# Patient Record
Sex: Male | Born: 1980 | Hispanic: No | Marital: Single | Smoking: Never smoker
Health system: Southern US, Community
[De-identification: ages and names within clinical notes are randomized; demographics above are authoritative.]

## PROBLEM LIST (undated history)

## (undated) DIAGNOSIS — F909 Attention-deficit hyperactivity disorder, unspecified type: Secondary | ICD-10-CM

## (undated) HISTORY — DX: Attention-deficit hyperactivity disorder, unspecified type: F90.9

---

## 2002-03-14 ENCOUNTER — Encounter: Payer: Self-pay | Admitting: Emergency Medicine

## 2002-03-14 ENCOUNTER — Emergency Department (HOSPITAL_COMMUNITY): Admission: EM | Admit: 2002-03-14 | Discharge: 2002-03-14 | Payer: Self-pay | Admitting: Emergency Medicine

## 2002-09-28 ENCOUNTER — Emergency Department (HOSPITAL_COMMUNITY): Admission: EM | Admit: 2002-09-28 | Discharge: 2002-09-28 | Payer: Self-pay | Admitting: Emergency Medicine

## 2002-09-28 ENCOUNTER — Encounter: Payer: Self-pay | Admitting: Emergency Medicine

## 2011-10-07 ENCOUNTER — Ambulatory Visit
Admission: RE | Admit: 2011-10-07 | Discharge: 2011-10-07 | Disposition: A | Discharge status: Home / Self Care | Payer: Self-pay | Source: Ambulatory Visit | Attending: Family Medicine | Admitting: Family Medicine

## 2011-10-07 ENCOUNTER — Other Ambulatory Visit: Payer: Self-pay | Admitting: Family Medicine

## 2011-10-07 DIAGNOSIS — M25561 Pain in right knee: Secondary | ICD-10-CM

## 2011-10-19 ENCOUNTER — Ambulatory Visit (INDEPENDENT_AMBULATORY_CARE_PROVIDER_SITE_OTHER): Payer: BC Managed Care – PPO | Admitting: Family Medicine

## 2011-10-19 ENCOUNTER — Ambulatory Visit: Payer: BC Managed Care – PPO

## 2011-10-19 DIAGNOSIS — R0789 Other chest pain: Secondary | ICD-10-CM | POA: Insufficient documentation

## 2011-10-19 DIAGNOSIS — Z87898 Personal history of other specified conditions: Secondary | ICD-10-CM

## 2011-10-19 DIAGNOSIS — B9789 Other viral agents as the cause of diseases classified elsewhere: Secondary | ICD-10-CM

## 2011-10-19 DIAGNOSIS — B349 Viral infection, unspecified: Secondary | ICD-10-CM | POA: Insufficient documentation

## 2011-10-19 DIAGNOSIS — T887XXA Unspecified adverse effect of drug or medicament, initial encounter: Secondary | ICD-10-CM

## 2011-10-19 DIAGNOSIS — T50905A Adverse effect of unspecified drugs, medicaments and biological substances, initial encounter: Secondary | ICD-10-CM

## 2011-10-19 LAB — POCT CBC
Granulocyte percent: 50.3 %G (ref 37–80)
HCT, POC: 43.9 % (ref 43.5–53.7)
Hemoglobin: 14.2 g/dL (ref 14.1–18.1)
Lymph, poc: 2.7 (ref 0.6–3.4)
MCH, POC: 30.1 pg (ref 27–31.2)
MCHC: 32.3 g/dL (ref 31.8–35.4)
MCV: 93.1 fL (ref 80–97)
MID (cbc): 0.6 (ref 0–0.9)
MPV: 8.6 fL (ref 0–99.8)
POC Granulocyte: 3.3 (ref 2–6.9)
POC LYMPH PERCENT: 40.2 %L (ref 10–50)
POC MID %: 9.5 %M (ref 0–12)
Platelet Count, POC: 189 10*3/uL (ref 142–424)
RBC: 4.72 M/uL (ref 4.69–6.13)
RDW, POC: 12.4 %
WBC: 6.6 10*3/uL (ref 4.6–10.2)

## 2011-10-19 MED ORDER — BENZONATATE 100 MG PO CAPS: 100.0000 mg | ORAL_CAPSULE | Freq: Two times a day (BID) | ORAL | Status: AC | PRN

## 2011-10-19 MED ORDER — SUCRALFATE 1 GM/10ML PO SUSP: 1.0000 g | Freq: Four times a day (QID) | ORAL | Status: AC

## 2011-10-19 MED ORDER — GI COCKTAIL ~~LOC~~
30.0000 mL | Freq: Once | ORAL | Status: AC
  Administered 2011-10-19: 30 mL via ORAL

## 2011-10-19 NOTE — Progress Notes (Signed)
  Subjective:    Patient ID: Jesse Rodriguez, male    DOB: 14-Mar-1981, 31 y.o.   MRN: [id number]  HPI Patient is here with a couple week history of right-sided chest pain. He has a long history of GERD. In the past he's been able to take acid blocking medications and get relief of his heartburn sensation. This has been different. Medicines did not help it and he keeps on having this burning sensation in the primarily right side of his chest. 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 He is concerned about possibility of any toxic exposure. He saw his dermatologist a couple of weeks ago was placed on a medication for his scalp which she's been taking a couple of times a day. This may be in the doxycycline family of medications but he is not sure the name of it. He has had any upper chart record infection since yesterday, a flulike illness. With that he is not febrile, taken ibuprofen, and being generally achy and ill feeling. He does not relate that to the other problem with chest pain.   Review of Systems  HENT: Positive for rhinorrhea.   Respiratory: Positive for cough.   Cardiovascular: Positive for chest pain.  Gastrointestinal: Negative.        Objective:   Physical Exam  Constitutional: He appears well-developed and well-nourished.  Eyes: Conjunctivae are normal.  Neck: Normal range of motion. No tracheal deviation present. No thyromegaly present.  Cardiovascular: Normal rate, regular rhythm and normal heart sounds.   Pulmonary/Chest: Effort normal and breath sounds normal. No respiratory distress. He has no wheezes. He has no rales. He exhibits no tenderness.  Abdominal: Bowel sounds are normal. He exhibits no distension and no mass. There is no tenderness. There is no rebound and no guarding.  Lymphadenopathy:    He has no cervical adenopathy.          Assessment & Plan:  X-ray was done of patient's chest lungs were clear without any  infiltrate normal cardiac silhouette normal chest x-ray  Patient had a right right sided substernal pains and got some relief with a GI cocktail. I believe he probably has esophagitis from the doxycycline and ibuprofen combined. Will treat with Carafate to see if that gives relief.  Respiratory tract infection is a nonspecific bowel infection and follow should give relief. Cough could irritate the reflux.

## 2011-10-19 NOTE — Patient Instructions (Signed)
Take the medication before meals and at bedtime

## 2011-10-20 LAB — COMPREHENSIVE METABOLIC PANEL
ALT: 27 U/L (ref 0–53)
AST: 33 U/L (ref 0–37)
Albumin: 4.6 g/dL (ref 3.5–5.2)
Alkaline Phosphatase: 45 U/L (ref 39–117)
BUN: 19 mg/dL (ref 6–23)
CO2: 28 mEq/L (ref 19–32)
Calcium: 9.5 mg/dL (ref 8.4–10.5)
Chloride: 102 mEq/L (ref 96–112)
Creat: 1.06 mg/dL (ref 0.50–1.35)
Glucose, Bld: 81 mg/dL (ref 70–99)
Potassium: 4.4 mEq/L (ref 3.5–5.3)
Sodium: 138 mEq/L (ref 135–145)
Total Bilirubin: 0.7 mg/dL (ref 0.3–1.2)
Total Protein: 7.1 g/dL (ref 6.0–8.3)

## 2011-10-22 ENCOUNTER — Encounter: Payer: Self-pay | Admitting: Family Medicine

## 2013-02-14 ENCOUNTER — Ambulatory Visit: Payer: BC Managed Care – PPO

## 2013-02-14 ENCOUNTER — Ambulatory Visit (INDEPENDENT_AMBULATORY_CARE_PROVIDER_SITE_OTHER): Payer: BC Managed Care – PPO | Admitting: Family Medicine

## 2013-02-14 ENCOUNTER — Other Ambulatory Visit: Payer: Self-pay | Admitting: *Deleted

## 2013-02-14 VITALS — BP 120/80 | HR 71 | Temp 97.7°F | Resp 18 | Wt 181.0 lb

## 2013-02-14 DIAGNOSIS — M25511 Pain in right shoulder: Secondary | ICD-10-CM

## 2013-02-14 DIAGNOSIS — M25519 Pain in unspecified shoulder: Secondary | ICD-10-CM

## 2013-02-14 MED ORDER — CYCLOBENZAPRINE HCL 10 MG PO TABS: 10.0000 mg | ORAL_TABLET | Freq: Three times a day (TID) | ORAL | Status: AC | PRN

## 2013-02-14 MED ORDER — IBUPROFEN 800 MG PO TABS: 800.0000 mg | ORAL_TABLET | Freq: Three times a day (TID) | ORAL | Status: DC | PRN

## 2013-02-14 NOTE — Progress Notes (Signed)
[facility]:  Office Visit  Chief Complaint:  Chief Complaint  Patient presents with  . Shoulder Pain  . Arm Pain    HPI: Jesse Rodriguez is a right handed  32 y.o. male who complains of  Acute right shoulder pain , posteriorly located radiating down to his arm and hands. About 2 weeks ago he was lifting weight about 315 lbs and felt he pulled something. He went to his PCP and xrays were done and he did not have any fractures or dislocation. He was told to go see ortho but did not since he eventually got better with rest. He was prescribed a  msk relaxer, and also ibuprofen but never filled the prescription since had left over ibuprofen and felt better after taking them. Today he was carrying groceries and his shoulder starting hurting again.Marland Kitchen  He has 8-10/10 sharp pain, constant, worse with any movement. He can't lift his arm due to pain, the pain is in the back of his shoulder  and goes posteriorly down his right arm to his fingers. He has had prior injuries to the right shoulder.  No prior torn ligaments/dislocation in this shoulder that he knows of. He has a history of left shoulder dislocation but this feels different.  He is active, Played football in HS, Currently does kickboxing and works in [employer]. He denies any recent injuries in kickboxing/at work.  Denies swelling, warmth, fevers. Denies taking any steroids/androgen supplements Was taking Doxycycline 100 mg daily for acne x 1 week but stopped several weeks ago.    Past Medical History  Diagnosis Date  . ADHD (attention deficit hyperactivity disorder)    History reviewed. No pertinent past surgical history. History   Social History  . Marital Status: Single    Spouse Name: N/A    Number of Children: N/A  . Years of Education: N/A   Social History Main Topics  . Smoking status: Never Smoker   . Smokeless tobacco: None  . Alcohol Use: Yes  . Drug Use: No  . Sexually Active: Yes   Other Topics  Concern  . None   Social History Narrative  . None   Family History  Problem Relation Age of Onset  . Hyperlipidemia Father   . Stroke Father    No Known Allergies Prior to Admission medications   Medication Sig Start Date End Date Taking? Authorizing Provider  ibuprofen (ADVIL,MOTRIN) 800 MG tablet Take 800 mg by mouth every 8 (eight) hours as needed for pain.   Yes Historical Provider, MD  cyclobenzaprine (FLEXERIL) 10 MG tablet Take 10 mg by mouth 3 (three) times daily as needed for muscle spasms.    Historical Provider, MD     ROS: The patient denies fevers, chills, night sweats, unintentional weight loss, chest pain, palpitations, wheezing, dyspnea on exertion, nausea, vomiting, abdominal pain, dysuria, hematuria, melena, numbness, weakness, or tingling.   All other systems have been reviewed and were otherwise negative with the exception of those mentioned in the HPI and as above.    PHYSICAL EXAM: Filed Vitals:   02/14/13 1940  BP: 120/80  Pulse: 71  Temp: 97.7 F (36.5 C)  Resp: 18   Filed Vitals:   02/14/13 1940  Weight: 181 lb (82.101 kg)   Body mass index is 26.72 kg/(m^2).  General: Alert, moderate distress HEENT:  Normocephalic, atraumatic, oropharynx patent.  Cardiovascular:  Regular rate and rhythm, no rubs murmurs or gallops.   Respiratory: Clear to auscultation bilaterally.  No  wheezes, rales, or rhonchi.  No cyanosis, no use of accessory musculature GI: No organomegaly, abdomen is soft and non-tender, positive bowel sounds.  No masses. Skin: No rashes. Neurologic: Facial musculature symmetric. Psychiatric: Patient is appropriate throughout our interaction. Lymphatic: No cervical lymphadenopathy Musculoskeletal: Gait intact. Neck-normal Left shoulder normal Right shoulder-patient is guarding his shoulder. He is in significant pain with AROM/PROM. I am unable to perform an adequate physical exam + radial pulse, sensation intact in fingers.      LABS: Results for orders placed in visit on 10/19/11  COMPREHENSIVE METABOLIC PANEL      Result Value Range   Sodium 138  135 - 145 mEq/L   Potassium 4.4  3.5 - 5.3 mEq/L   Chloride 102  96 - 112 mEq/L   CO2 28  19 - 32 mEq/L   Glucose, Bld 81  70 - 99 mg/dL   BUN 19  6 - 23 mg/dL   Creat 6.60  6.30 - 1.60 mg/dL   Total Bilirubin 0.7  0.3 - 1.2 mg/dL   Alkaline Phosphatase 45  39 - 117 U/L   AST 33  0 - 37 U/L   ALT 27  0 - 53 U/L   Total Protein 7.1  6.0 - 8.3 g/dL   Albumin 4.6  3.5 - 5.2 g/dL   Calcium 9.5  8.4 - 10.9 mg/dL  POCT CBC      Result Value Range   WBC 6.6  4.6 - 10.2 K/uL   Lymph, poc 2.7  0.6 - 3.4   POC LYMPH PERCENT 40.2  10 - 50 %L   MID (cbc) 0.6  0 - 0.9   POC MID % 9.5  0 - 12 %M   POC Granulocyte 3.3  2 - 6.9   Granulocyte percent 50.3  37 - 80 %G   RBC 4.72  4.69 - 6.13 M/uL   Hemoglobin 14.2  14.1 - 18.1 g/dL   HCT, POC 32.3  55.7 - 53.7 %   MCV 93.1  80 - 97 fL   MCH, POC 30.1  27 - 31.2 pg   MCHC 32.3  31.8 - 35.4 g/dL   RDW, POC 32.2     Platelet Count, POC 189  142 - 424 K/uL   MPV 8.6  0 - 99.8 fL     EKG/XRAY:   Primary read interpreted by Dr. Conley Rolls at [facility]. No fractures or dislocation   ASSESSMENT/PLAN: Encounter Diagnosis  Name Primary?  . Pain in joint, shoulder region, right Yes   Jesse Rodriguez is a 32 y/o male who is here with acute right shoulder pain of unknown etiology. I was unable to do a thorough shoulder exam on him due to guarding and  pain with even slight active ROM.  He was given lortab 7.5 mg elixir for pain prior to xrays upon request and was fine. He tolerated xrays ok but was in pain during the exam. His xrays are negative for any dislocation or fractures.  Upon examination of his shoulder he became diaphoretic and dizzy. This was 15-20 min after he had gotten the Lortab. I suspect this may be a pain response  and he was having a vasovagal reaction. However it should be noted that he is on Adderall which he forgot to  tell me  and also forgot to tell me that he had some reaction to Percocet in the past.   He was better after we laid him down, he had a cold compress on  his head and drank some water.  He was given a sling to take home to use. I rx him flexeril and ibuprofen. I would be interested to know if there is any organic pathology with his shoulder. We will refer him to [facility] ortho for further evaluation since he has been there before. I am hoping he can be seen tomorrow as a work-in patient.      Hamilton Capri PHUONG, DO 02/15/2013 6:49 AM

## 2013-02-27 ENCOUNTER — Telehealth: Payer: Self-pay | Admitting: Family Medicine

## 2013-02-27 NOTE — Telephone Encounter (Signed)
LM to see how he is doing and what ortho said.

## 2015-07-15 ENCOUNTER — Ambulatory Visit (INDEPENDENT_AMBULATORY_CARE_PROVIDER_SITE_OTHER): Payer: Self-pay | Admitting: Sports Medicine

## 2015-07-15 DIAGNOSIS — M25562 Pain in left knee: Secondary | ICD-10-CM

## 2015-07-15 DIAGNOSIS — G629 Polyneuropathy, unspecified: Secondary | ICD-10-CM | POA: Insufficient documentation

## 2015-07-15 DIAGNOSIS — G5603 Carpal tunnel syndrome, bilateral upper limbs: Secondary | ICD-10-CM

## 2015-07-15 DIAGNOSIS — G6289 Other specified polyneuropathies: Secondary | ICD-10-CM

## 2015-07-15 DIAGNOSIS — G5600 Carpal tunnel syndrome, unspecified upper limb: Secondary | ICD-10-CM | POA: Insufficient documentation

## 2015-07-15 LAB — CBC WITH DIFFERENTIAL/PLATELET
Basophils Absolute: 0 K/uL (ref 0.0–0.1)
Basophils Relative: 0 % (ref 0–1)
Eosinophils Absolute: 0 10*3/uL (ref 0.0–0.7)
Eosinophils Relative: 1 % (ref 0–5)
HCT: 45.1 % (ref 39.0–52.0)
Hemoglobin: 15.8 g/dL (ref 13.0–17.0)
Lymphocytes Relative: 49 % — ABNORMAL HIGH (ref 12–46)
Lymphs Abs: 2.4 K/uL (ref 0.7–4.0)
MCH: 31.7 pg (ref 26.0–34.0)
MCHC: 35 g/dL (ref 30.0–36.0)
MCV: 90.6 fL (ref 78.0–100.0)
MPV: 10.2 fL (ref 8.6–12.4)
Monocytes Absolute: 0.3 10*3/uL (ref 0.1–1.0)
Monocytes Relative: 7 % (ref 3–12)
Neutro Abs: 2.1 K/uL (ref 1.7–7.7)
Neutrophils Relative %: 43 % (ref 43–77)
Platelets: 235 10*3/uL (ref 150–400)
RBC: 4.98 MIL/uL (ref 4.22–5.81)
RDW: 13 % (ref 11.5–15.5)
WBC: 4.8 K/uL (ref 4.0–10.5)

## 2015-07-15 LAB — POCT GLYCOSYLATED HEMOGLOBIN (HGB A1C): Hemoglobin A1C: 5.4

## 2015-07-15 MED ORDER — IBUPROFEN 800 MG PO TABS: 800.0000 mg | ORAL_TABLET | Freq: Three times a day (TID) | ORAL | Status: AC | PRN

## 2015-07-15 NOTE — Progress Notes (Signed)
  Subjective:    CC: left knee pain  HPI:  This is a pleasant 34 year old male, he comes in with left knee pain after running, the pain has been present for a month, it is intense and is on the posterior lateral joint line, minimal swelling, no mechanical symptoms. He typically runs 4 miles per day.  He also tells me he's been having some odd tingling and swelling type sensations in his hands and fingertips, worse at night, occasionally he has felt it in both feet. No family history of neuropathy, he does have a family history of diabetes mellitus type 2. Symptoms are moderate, persistent.  Past medical history, Surgical history, Family history not pertinant except as noted below, Social history, Allergies, and medications have been entered into the medical record, reviewed, and no changes needed.   Review of Systems: No headache, visual changes, nausea, vomiting, diarrhea, constipation, dizziness, abdominal pain, skin rash, fevers, chills, night sweats, swollen lymph nodes, weight loss, chest pain, body aches, joint swelling, muscle aches, shortness of breath, mood changes, visual or auditory hallucinations.  Objective:    General: Well Developed, well nourished, and in no acute distress.  Neuro: Alert and oriented x3, extra-ocular muscles intact, sensation grossly intact.  HEENT: Normocephalic, atraumatic, pupils equal round reactive to light, neck supple, no masses, no lymphadenopathy, thyroid nonpalpable.  Skin: Warm and dry, no rashes noted.  Cardiac: Regular rate and rhythm, no murmurs rubs or gallops.  Respiratory: Clear to auscultation bilaterally. Not using accessory muscles, speaking in full sentences.  Abdominal: Soft, nontender, nondistended, positive bowel sounds, no masses, no organomegaly.  Bilateral Wrist: Inspection normal with no visible erythema or swelling. ROM smooth and normal with good flexion and extension and ulnar/radial deviation that is symmetrical with opposite  wrist. Palpation is normal over metacarpals, navicular, lunate, and TFCC; tendons without tenderness/ swelling No snuffbox tenderness. No tenderness over Canal of Gu[place]. Strength 5/5 in all directions without pain. Negative Finkelstein, tinel's and phalens. Negative Watson's test. Left Knee: Normal to inspection with no erythema or effusion or obvious bony abnormalities. ROM tender to palpation of the posterior lateral joint line normal in flexion and extension and lower leg rotation. Ligaments with solid consistent endpoints including ACL, PCL, LCL, MCL. Positive McMurray's test with reproduction of posterior lateral joint line pain with a palpable popn painful patellar compression. Patellar and quadriceps tendons unremarkable. Hamstring and quadriceps strength is normal.  Procedure: Real-time Ultrasound Guided Injection of left knee Device: GE Logiq E  Verbal informed consent obtained.  Time-out conducted.  Noted no overlying erythema, induration, or other signs of local infection.  Skin prepped in a sterile fashion.  Local anesthesia: Topical Ethyl chloride.  With sterile technique and under real time ultrasound guidance:  2 mL kenalog 40, 4 mL lidocaine injected easily into the suprapatellar recess. Completed without difficulty  Pain immediately resolved suggesting accurate placement of the medication.  Advised to call if fevers/chills, erythema, induration, drainage, or persistent bleeding.  Images permanently stored and available for review in the ultrasound unit.  Impression: Technically successful ultrasound guided injection.  Impression and Recommendations:    The patient was counselled, risk factors were discussed, anticipatory guidance given.

## 2015-07-15 NOTE — Assessment & Plan Note (Signed)
Patient will obtain bilateral nighttime splinting over-the-counter,and return to see me in one month, nerve conduction study if no better.

## 2015-07-15 NOTE — Assessment & Plan Note (Signed)
With lateral joint line pain, and a positive McMurray sign this likely represents a lateral meniscal tear. Injection, x-rays, knee brace. Continue ibuprofen, home rehabilitation exercises, return to see me in one month, MRI if no better.

## 2015-07-15 NOTE — Assessment & Plan Note (Addendum)
Checking some routine blood work, hemoglobin A1c was negative. Adding vitamin D levels per patient request.

## 2015-07-16 LAB — COMPREHENSIVE METABOLIC PANEL WITH GFR
ALT: 23 U/L (ref 9–46)
AST: 18 U/L (ref 10–40)
Alkaline Phosphatase: 42 U/L (ref 40–115)
Calcium: 9.2 mg/dL (ref 8.6–10.3)
Glucose, Bld: 79 mg/dL (ref 65–99)
Potassium: 4.2 mmol/L (ref 3.5–5.3)
Total Bilirubin: 1 mg/dL (ref 0.2–1.2)
Total Protein: 7.1 g/dL (ref 6.1–8.1)

## 2015-07-16 LAB — SEDIMENTATION RATE: Sed Rate: 1 mm/h (ref 0–15)

## 2015-07-16 LAB — COMPREHENSIVE METABOLIC PANEL
Albumin: 4.3 g/dL (ref 3.6–5.1)
BUN: 18 mg/dL (ref 7–25)
CO2: 30 mmol/L (ref 20–31)
Chloride: 101 mmol/L (ref 98–110)
Creat: 1.12 mg/dL (ref 0.60–1.35)
Sodium: 139 mmol/L (ref 135–146)

## 2015-07-16 LAB — TSH: TSH: 1.846 u[IU]/mL (ref 0.350–4.500)

## 2015-07-16 LAB — VITAMIN B12: Vitamin B-12: 892 pg/mL (ref 211–911)

## 2015-07-16 LAB — VITAMIN D 25 HYDROXY (VIT D DEFICIENCY, FRACTURES): Vit D, 25-Hydroxy: 27 ng/mL — ABNORMAL LOW (ref 30–100)

## 2015-07-17 LAB — HEAVY METALS, BLOOD
Arsenic: <3 ug/L (ref <23)
Lead: <2 ug/dL (ref <10)
Mercury, B: <4 mcg/L (ref <=10)

## 2016-12-15 DIAGNOSIS — Z23 Encounter for immunization: Secondary | ICD-10-CM | POA: Diagnosis not present

## 2016-12-27 ENCOUNTER — Ambulatory Visit (INDEPENDENT_AMBULATORY_CARE_PROVIDER_SITE_OTHER): Payer: 59 | Admitting: Physician Assistant

## 2016-12-27 VITALS — BP 110/88 | HR 88 | Temp 98.2°F | Resp 16

## 2016-12-27 DIAGNOSIS — H5711 Ocular pain, right eye: Secondary | ICD-10-CM | POA: Diagnosis not present

## 2016-12-27 DIAGNOSIS — T2690XA Corrosion of unspecified eye and adnexa, part unspecified, initial encounter: Secondary | ICD-10-CM | POA: Diagnosis not present

## 2016-12-27 MED ORDER — CIPROFLOXACIN HCL 0.3 % OP SOLN
1.0000 [drp] | OPHTHALMIC | 0 refills | Status: DC
Start: 2016-12-27 — End: 2016-12-29

## 2016-12-27 NOTE — Patient Instructions (Addendum)
  1 drop into Right eye every 2 hours x 2 days.  1 drop into Right eye every 4 hours x 5 days.   Seek immediate help if you experience increased blurry vision, double vision, increased eye pain.   Thank you for coming in today. I hope you feel we met your needs.  Feel free to call [facility] if you have any questions or further requests.  Please consider signing up for MyChart if you do not already have it, as this is a great way to communicate with me.  Best,  Whitney McVey, PA-C  IF you received an x-ray today, you will receive an invoice from [facility]. Please contact [facility] at [phone] with questions or concerns regarding your invoice.   IF you received labwork today, you will receive an invoice from [facility]. Please contact [facility] at [phone] with questions or concerns regarding your invoice.   Our billing staff will not be able to assist you with questions regarding bills from these companies.  You will be contacted with the lab results as soon as they are available. The fastest way to get your results is to activate your My Chart account. Instructions are located on the last page of this paperwork. If you have not heard from [place] regarding the results in 2 weeks, please contact this office.

## 2016-12-27 NOTE — Progress Notes (Signed)
   Jesse Rodriguez  MRN: [id number] DOB: 1980/12/21  PCP: No PCP Per Patient  Subjective:  Pt is a 36 year old male presents to clinic for eye injury. He was at work today when he splashed paint thinner into his right eye.  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 He immediately washed his eye out with water after the incident.  Denies photophobia, double vision, decreased visual acuity.   Review of Systems  Eyes: Positive for pain, redness and visual disturbance. Negative for photophobia, discharge and itching.    Patient Active Problem List   Diagnosis Date Noted  . Left knee pain 07/15/2015  . Carpal tunnel syndrome 07/15/2015  . Peripheral neuropathy (HCC) 07/15/2015  . Chest pain, atypical 10/19/2011  . Adverse effects of medication 10/19/2011    Current Outpatient Prescriptions on File Prior to Visit  Medication Sig Dispense Refill  . amphetamine-dextroamphetamine (ADDERALL) 30 MG tablet Take 30 mg by mouth daily.    . cyclobenzaprine (FLEXERIL) 10 MG tablet Take 1 tablet (10 mg total) by mouth 3 (three) times daily as needed for muscle spasms. (Patient not taking: Reported on 12/27/2016) 30 tablet 0  . ibuprofen (ADVIL,MOTRIN) 800 MG tablet Take 1 tablet (800 mg total) by mouth every 8 (eight) hours as needed. (Patient not taking: Reported on 12/27/2016) 60 tablet 2   No current facility-administered medications on file prior to visit.     No Known Allergies   Objective:  BP (!) 144/98 (Cuff Size: Normal)   Pulse 88   Temp 98.2 F (36.8 C) (Oral)   Resp 16   SpO2 99%   Physical Exam  Constitutional: He is oriented to person, place, and time and well-developed, well-nourished, and in no distress. No distress.  Eyes: EOM are normal. Pupils are equal, round, and reactive to light. Lids are everted and swept, no foreign bodies found. Right conjunctiva is injected. Right  conjunctiva has no hemorrhage.  Slit lamp exam:      The right eye shows fluorescein uptake (medial cornea, scattered pinpoint area of uptake).  pH 7  Cardiovascular: Normal rate, regular rhythm and normal heart sounds.   Neurological: He is alert and oriented to person, place, and time. GCS score is 15.  Skin: Skin is warm and dry.  Psychiatric: Mood, memory, affect and judgment normal.  Vitals reviewed.  Visual acuity: R 20/25, L 20/40, Both 20/15  Assessment and Plan :  1. Chemical burn of eye 2. Pain of right eye - ciprofloxacin (CILOXAN) 0.3 % ophthalmic solution; Place 1 drop into the right eye every 2 (two) hours. 1 gtt in the eye Q2 hrs while awake x 2 days, then 1 gtt Q4 hrs while awake x 5 days  Dispense: 5 mL; Refill: 0 - No decrease in visual acuity. Return precautions discussed with pt. He understands and agrees. Advised eye protection while at work.   Marco Collie, PA-C  Primary Care at [facility] 12/27/2016 1:37 PM

## 2016-12-27 NOTE — Progress Notes (Signed)
EYE FLUSHED X 10 MINUTES

## 2016-12-29 ENCOUNTER — Other Ambulatory Visit: Payer: Self-pay | Admitting: Emergency Medicine

## 2016-12-29 DIAGNOSIS — T2690XA Corrosion of unspecified eye and adnexa, part unspecified, initial encounter: Secondary | ICD-10-CM

## 2016-12-29 MED ORDER — CIPROFLOXACIN HCL 0.3 % OP SOLN: 1.0000 [drp] | OPHTHALMIC | 0 refills | Status: AC

## 2016-12-29 MED ORDER — CIPROFLOXACIN HCL 0.3 % OP SOLN: 1.0000 [drp] | OPHTHALMIC | 0 refills | Status: DC

## 2017-04-06 DIAGNOSIS — M940 Chondrocostal junction syndrome [Tietze]: Secondary | ICD-10-CM | POA: Diagnosis not present

## 2017-04-06 DIAGNOSIS — R1011 Right upper quadrant pain: Secondary | ICD-10-CM | POA: Diagnosis not present

## 2017-05-18 DIAGNOSIS — L638 Other alopecia areata: Secondary | ICD-10-CM | POA: Diagnosis not present

## 2017-05-18 DIAGNOSIS — L7211 Pilar cyst: Secondary | ICD-10-CM | POA: Diagnosis not present

## 2017-05-27 DIAGNOSIS — R6884 Jaw pain: Secondary | ICD-10-CM | POA: Diagnosis not present

## 2017-05-27 DIAGNOSIS — H9202 Otalgia, left ear: Secondary | ICD-10-CM | POA: Diagnosis not present

## 2017-05-27 DIAGNOSIS — J329 Chronic sinusitis, unspecified: Secondary | ICD-10-CM | POA: Diagnosis not present

## 2017-06-08 DIAGNOSIS — R208 Other disturbances of skin sensation: Secondary | ICD-10-CM | POA: Diagnosis not present

## 2017-06-08 DIAGNOSIS — L669 Cicatricial alopecia, unspecified: Secondary | ICD-10-CM | POA: Diagnosis not present

## 2017-06-08 DIAGNOSIS — L723 Sebaceous cyst: Secondary | ICD-10-CM | POA: Diagnosis not present

## 2017-09-07 DIAGNOSIS — J0101 Acute recurrent maxillary sinusitis: Secondary | ICD-10-CM | POA: Diagnosis not present

## 2017-10-18 DIAGNOSIS — R509 Fever, unspecified: Secondary | ICD-10-CM | POA: Diagnosis not present

## 2018-07-03 DIAGNOSIS — E538 Deficiency of other specified B group vitamins: Secondary | ICD-10-CM | POA: Diagnosis not present

## 2018-07-03 DIAGNOSIS — R5382 Chronic fatigue, unspecified: Secondary | ICD-10-CM | POA: Diagnosis not present

## 2018-07-03 DIAGNOSIS — E559 Vitamin D deficiency, unspecified: Secondary | ICD-10-CM | POA: Diagnosis not present

## 2019-05-08 DIAGNOSIS — E291 Testicular hypofunction: Secondary | ICD-10-CM | POA: Diagnosis not present

## 2020-02-04 ENCOUNTER — Ambulatory Visit: Payer: Self-pay | Admitting: Sports Medicine

## 2020-02-04 ENCOUNTER — Telehealth (INDEPENDENT_AMBULATORY_CARE_PROVIDER_SITE_OTHER): Payer: BC Managed Care – PPO | Admitting: Sports Medicine

## 2020-02-04 DIAGNOSIS — S6991XA Unspecified injury of right wrist, hand and finger(s), initial encounter: Secondary | ICD-10-CM

## 2020-02-04 MED ORDER — PREDNISONE 50 MG PO TABS: ORAL_TABLET | ORAL | 0 refills | Status: DC

## 2020-02-04 NOTE — Progress Notes (Signed)
   Virtual Visit via WebEx/MyChart   I connected with  Jesse Rodriguez  on 02/04/20 via WebEx/MyChart/Doximity Video and verified that I am speaking with the correct person using two identifiers.   I discussed the limitations, risks, security and privacy concerns of performing an evaluation and management service by WebEx/MyChart/Doximity Video, including the higher likelihood of inaccurate diagnosis and treatment, and the availability of in person appointments.  We also discussed the likely need of an additional face to face encounter for complete and high quality delivery of care.  I also discussed with the patient that there may be a patient responsible charge related to this service. The patient expressed understanding and wishes to proceed.  Provider location is either at home or medical facility. Patient location is at their home, different from provider location. People involved in care of the patient during this telehealth encounter were myself, my nurse/medical assistant, and my front office/scheduling team member.  Review of Systems: No fevers, chills, night sweats, weight loss, chest pain, or shortness of breath.   Objective Findings:    General: Speaking full sentences, no audible heavy breathing.  Sounds alert and appropriately interactive.  Appears well.  Face symmetric.  Extraocular movements intact.  Pupils equal and round.  No nasal flaring or accessory muscle use visualized.  Independent interpretation of tests performed by another provider:   None.  Brief History, Exam, Impression, and Recommendations:    Right wrist injury For over 3 weeks this pleasant 39 year old male has had pain in his right wrist, ulnar aspect after being injured doing jujitsu. It is worse with ulnar and radial deviation. On the video camera it does appear quite swollen. Ibuprofen 800 mg 3 times daily has not been effective. He should start a wrist brace, I am going to call in 5 days of prednisone, if  no better in 1 week to 2 weeks I would like to see him for consideration of extensor carpi ulnaris injection. Certainly triangular fibrocartilaginous complex injury is possible as well. An in person follow-up should be arranged in 1 to 2 weeks.   I discussed the above assessment and treatment plan with the patient. The patient was provided an opportunity to ask questions and all were answered. The patient agreed with the plan and demonstrated an understanding of the instructions.   The patient was advised to call back or seek an in-person evaluation if the symptoms worsen or if the condition fails to improve as anticipated.   I provided 30 minutes of face to face and non-face-to-face time during this encounter date, time was needed to gather information, review chart, records, communicate/coordinate with staff remotely, as well as complete documentation.   ___________________________________________ Jesse Rodriguez. Benjamin Stain, M.D., ABFM., CAQSM. Primary Care and Sports Medicine [facility]  Adjunct Instructor of Family Medicine  [employer]

## 2020-02-04 NOTE — Assessment & Plan Note (Signed)
For over 3 weeks this pleasant 39 year old male has had pain in his right wrist, ulnar aspect after being injured doing jujitsu. It is worse with ulnar and radial deviation. On the video camera it does appear quite swollen. Ibuprofen 800 mg 3 times daily has not been effective. He should start a wrist brace, I am going to call in 5 days of prednisone, if no better in 1 week to 2 weeks I would like to see him for consideration of extensor carpi ulnaris injection. Certainly triangular fibrocartilaginous complex injury is possible as well. An in person follow-up should be arranged in 1 to 2 weeks.

## 2020-02-29 ENCOUNTER — Ambulatory Visit (INDEPENDENT_AMBULATORY_CARE_PROVIDER_SITE_OTHER): Payer: BC Managed Care – PPO | Admitting: Sports Medicine

## 2020-02-29 ENCOUNTER — Other Ambulatory Visit: Payer: Self-pay

## 2020-02-29 ENCOUNTER — Ambulatory Visit (INDEPENDENT_AMBULATORY_CARE_PROVIDER_SITE_OTHER): Payer: BC Managed Care – PPO

## 2020-02-29 DIAGNOSIS — S6991XA Unspecified injury of right wrist, hand and finger(s), initial encounter: Secondary | ICD-10-CM | POA: Diagnosis not present

## 2020-02-29 DIAGNOSIS — S6991XD Unspecified injury of right wrist, hand and finger(s), subsequent encounter: Secondary | ICD-10-CM | POA: Diagnosis not present

## 2020-02-29 DIAGNOSIS — M25531 Pain in right wrist: Secondary | ICD-10-CM | POA: Diagnosis not present

## 2020-02-29 MED ORDER — HYDROCODONE-ACETAMINOPHEN 10-325 MG PO TABS: 1.0000 | ORAL_TABLET | Freq: Three times a day (TID) | ORAL | 0 refills | Status: DC | PRN

## 2020-02-29 NOTE — Assessment & Plan Note (Addendum)
This is a very pleasant 39 year old male, he is doing jujitsu, his wrist was twisted during a submission move, he has had pain now for approximately 7 to 8 weeks. He improved a little bit with ibuprofen 800 mg, but still has significant pain, we did some wrist rehab exercises, his pain is localized over the ulnar aspect as well as the dorsal radiocarpal joint and worse with terminal extension. Negative Watson's test. Today we injected his radiocarpal joint. He will be placed in a Velcro wrist brace. I would like to see him back in a virtual visit in 2 weeks and if still no better we will absolutely order an MRI.  Patient calls back at about 4:00pm, he is having severe pain, I discussed increased pain is typical when the numbing medicine wears off, adding some hydrocodone, he will ice it 20 minutes 3-4 times a day.

## 2020-02-29 NOTE — Progress Notes (Addendum)
    Procedures performed today:    Procedure: Real-time Ultrasound Guided injection of the right radiocarpal joint Device: Samsung HS60  Verbal informed consent obtained.  Time-out conducted.  Noted no overlying erythema, induration, or other signs of local infection.  Skin prepped in a sterile fashion.  Local anesthesia: Topical Ethyl chloride.  With sterile technique and under real time ultrasound guidance: 1 cc Kenalog 40, 1 cc lidocaine injected easily  completed without difficulty  Pain immediately resolved suggesting accurate placement of the medication.  Advised to call if fevers/chills, erythema, induration, drainage, or persistent bleeding.  Images permanently stored and available for review in the ultrasound unit.  Impression: Technically successful ultrasound guided injection.  Independent interpretation of notes and tests performed by another provider:   None.  Brief History, Exam, Impression, and Recommendations:    Right wrist injury This is a very pleasant 39 year old male, he is doing jujitsu, his wrist was twisted during a submission move, he has had pain now for approximately 7 to 8 weeks. He improved a little bit with ibuprofen 800 mg, but still has significant pain, we did some wrist rehab exercises, his pain is localized over the ulnar aspect as well as the dorsal radiocarpal joint and worse with terminal extension. Negative Watson's test. Today we injected his radiocarpal joint. He will be placed in a Velcro wrist brace. I would like to see him back in a virtual visit in 2 weeks and if still no better we will absolutely order an MRI.  Patient calls back at about 4:00pm, he is having severe pain, I discussed increased pain is typical when the numbing medicine wears off, adding some hydrocodone, he will ice it 20 minutes 3-4 times a day.    ___________________________________________ [name]. Benjamin Stain, M.D., ABFM., CAQSM. Primary Care and Sports  Medicine [facility]  Adjunct Instructor of Family Medicine  [employer]

## 2020-02-29 NOTE — Addendum Note (Signed)
Addended by: Monica Becton on: 02/29/2020 04:50 PM   Modules accepted: Orders

## 2020-03-17 ENCOUNTER — Ambulatory Visit: Payer: BC Managed Care – PPO | Admitting: Sports Medicine

## 2020-04-26 DIAGNOSIS — H00013 Hordeolum externum right eye, unspecified eyelid: Secondary | ICD-10-CM | POA: Diagnosis not present

## 2020-05-12 ENCOUNTER — Ambulatory Visit (INDEPENDENT_AMBULATORY_CARE_PROVIDER_SITE_OTHER): Payer: BC Managed Care – PPO | Admitting: Sports Medicine

## 2020-05-12 ENCOUNTER — Ambulatory Visit (INDEPENDENT_AMBULATORY_CARE_PROVIDER_SITE_OTHER): Payer: BC Managed Care – PPO

## 2020-05-12 ENCOUNTER — Other Ambulatory Visit: Payer: Self-pay

## 2020-05-12 ENCOUNTER — Other Ambulatory Visit: Payer: Self-pay | Admitting: Sports Medicine

## 2020-05-12 DIAGNOSIS — S6991XA Unspecified injury of right wrist, hand and finger(s), initial encounter: Secondary | ICD-10-CM

## 2020-05-12 NOTE — Progress Notes (Addendum)
    Procedures performed today:    None.  Independent interpretation of notes and tests performed by another provider:   X-rays personally reviewed, there is no obvious fracture, at the radial aspect of the base of the distal phalanx there may be a small fleck of an avulsion, likely clinically insignificant..  Brief History, Exam, Impression, and Recommendations:    Injury of right ring finger Jesse Rodriguez had what sounds to be a DIP dislocation, he reduced it himself while boxing. X-rays are unremarkable, no obvious evidence of a fracture with the exception of a small fleck that could potentially represent a tiny avulsion at the radial base of the distal phalanx, I placed him in a stack splint, he will do this for 3 to 4 weeks, when he is no longer tender and the swelling has improved with meloxicam we can start some home hand therapy. Return in a month.    ___________________________________________ [name]. Benjamin Stain, M.D., ABFM., CAQSM. Primary Care and Sports Medicine [facility]  Adjunct Instructor of Family Medicine  [employer]

## 2020-05-12 NOTE — Assessment & Plan Note (Signed)
Possible boxing injury, x-rays of the right ring finger, DIP, he will buddy tape the third and fourth fingers together in the meantime.

## 2020-05-12 NOTE — Assessment & Plan Note (Addendum)
Jesse Rodriguez had what sounds to be a DIP dislocation, he reduced it himself while boxing. X-rays are unremarkable, no obvious evidence of a fracture with the exception of a small fleck that could potentially represent a tiny avulsion at the radial base of the distal phalanx, I placed him in a stack splint, he will do this for 3 to 4 weeks, when he is no longer tender and the swelling has improved with meloxicam we can start some home hand therapy. Return in a month.

## 2020-08-25 DIAGNOSIS — E559 Vitamin D deficiency, unspecified: Secondary | ICD-10-CM | POA: Diagnosis not present

## 2020-08-25 DIAGNOSIS — F900 Attention-deficit hyperactivity disorder, predominantly inattentive type: Secondary | ICD-10-CM | POA: Diagnosis not present

## 2020-08-25 DIAGNOSIS — E785 Hyperlipidemia, unspecified: Secondary | ICD-10-CM | POA: Diagnosis not present

## 2021-05-14 DIAGNOSIS — F900 Attention-deficit hyperactivity disorder, predominantly inattentive type: Secondary | ICD-10-CM | POA: Diagnosis not present

## 2021-08-12 DIAGNOSIS — K219 Gastro-esophageal reflux disease without esophagitis: Secondary | ICD-10-CM | POA: Diagnosis not present

## 2021-08-12 DIAGNOSIS — F900 Attention-deficit hyperactivity disorder, predominantly inattentive type: Secondary | ICD-10-CM | POA: Diagnosis not present

## 2022-08-01 IMAGING — DX DG FINGER RING 2+V*R*
3 series · 3 of 3 positions shown · non-contrast
Comparison: None.

CLINICAL DATA: Pain at 4 DIP joint.  Boxing injury.

EXAM:
RIGHT RING FINGER 2+V

[finger ap]
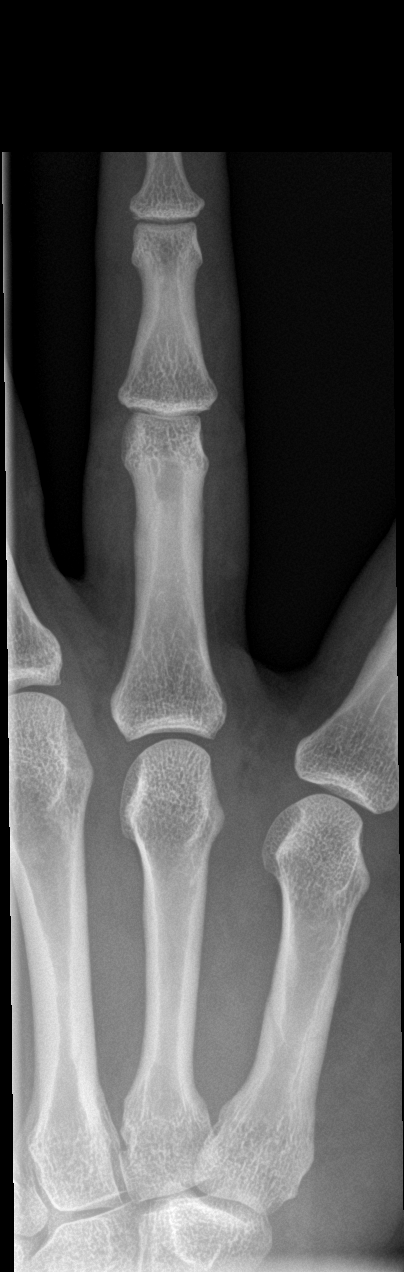

[finger obl]
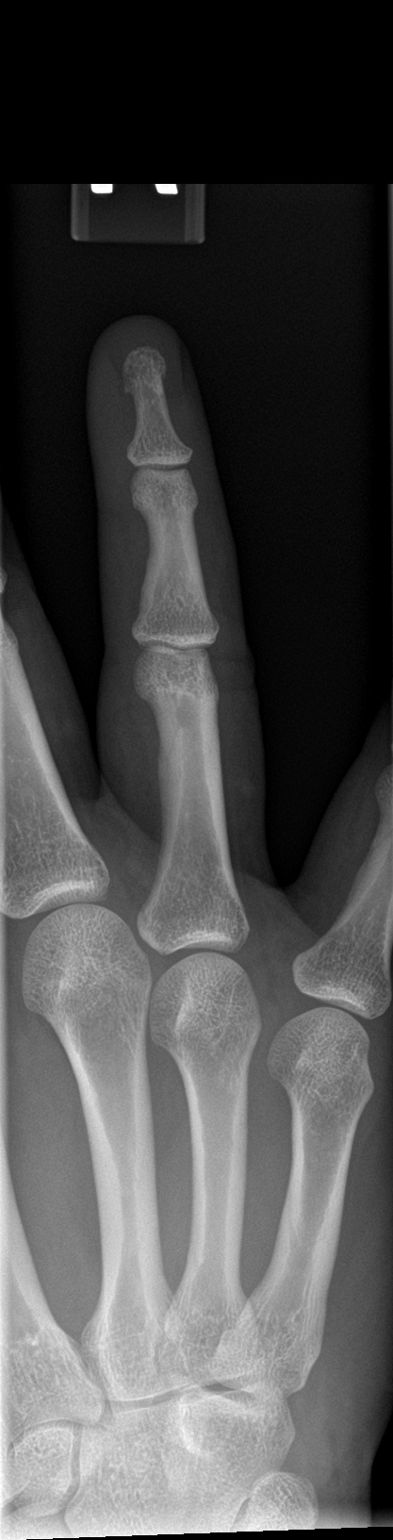

[finger lat]
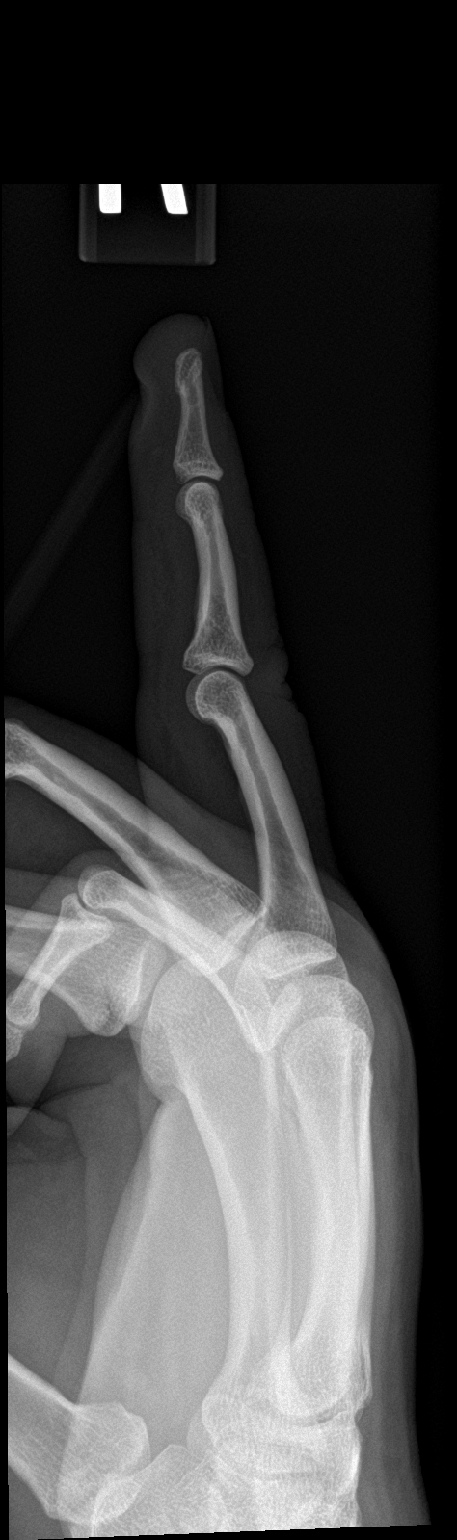

[3 of 3 positions shown; findings below may reference images not displayed]

FINDINGS: There is no evidence of fracture or dislocation. There is no
evidence of arthropathy or other focal bone abnormality. Soft
tissues are unremarkable.
IMPRESSION: Negative.
